# Patient Record
Sex: Male | Born: 1985 | Race: White | Hispanic: No | Marital: Single | Smoking: Current every day smoker
Health system: Southern US, Community
[De-identification: ages and names within clinical notes are randomized; demographics above are authoritative.]

---
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
## 2021-06-20 ENCOUNTER — Ambulatory Visit
Admission: EM | Admit: 2021-06-20 | Discharge: 2021-06-20 | Disposition: A | Discharge status: Home / Self Care | Payer: Self-pay | Attending: Nurse Practitioner | Admitting: Nurse Practitioner

## 2021-06-20 DIAGNOSIS — H66002 Acute suppurative otitis media without spontaneous rupture of ear drum, left ear: Secondary | ICD-10-CM

## 2021-06-20 MED ORDER — AZITHROMYCIN 250 MG PO TABS: ORAL_TABLET | ORAL | 0 refills | Status: DC

## 2021-06-20 NOTE — Discharge Instructions (Signed)
-   You have an ear infection of your left ear -Please start the azithromycin to help clear this up -Please stop using Q-tips; do not insert anything into either of your ears -Seek care if symptoms persist or worsen despite treatment

## 2021-06-20 NOTE — ED Provider Notes (Signed)
[facility]    CSN: [id number] Arrival date & time: 06/20/21  1239      History   Chief Complaint Chief Complaint  Patient presents with   Otalgia    HPI Cody Williamson is a 36 y.o. male.   Patient presents with left ear pain that has been ongoing for the past month.  He denies any fevers, recent upper respiratory infection, drainage from the ear, itching.  He does feel like his hearing is decreased out of that ear.  He does use Q-tips on a regular basis.  He has tried boiling water and hot wax candles which have not helped.  Patient denies any antibiotic use in the past 3 months.  Does report a history of allergy to penicillin.   History reviewed. No pertinent past medical history.  There are no problems to display for this patient.   History reviewed. No pertinent surgical history.     Home Medications    Prior to Admission medications   Medication Sig Start Date End Date Taking? Authorizing Provider  azithromycin (ZITHROMAX) 250 MG tablet Take (2) tablets by mouth on day 1, then take (1) tablet by mouth on days 2-5. 06/20/21  Yes Eulogio Bear, NP    Family History History reviewed. No pertinent family history.  Social History Social History   Tobacco Use   Smoking status: Every Day    Packs/day: 0.50    Types: Cigarettes   Smokeless tobacco: Never  Substance Use Topics   Alcohol use: Yes    Alcohol/week: 3.0 standard drinks    Types: 3 Cans of beer per week   Drug use: Never     Allergies   Penicillins   Review of Systems Review of Systems Per HPI  Physical Exam Triage Vital Signs ED Triage Vitals  Enc Vitals Group     BP 06/20/21 1423 (!) 142/82     Pulse Rate 06/20/21 1423 96     Resp 06/20/21 1423 16     Temp 06/20/21 1423 98.7 F (37.1 C)     Temp Source 06/20/21 1423 Oral     SpO2 06/20/21 1423 97 %     Weight --      Height --      Head Circumference --      Peak Flow --      Pain Score 06/20/21 1421 5      Pain Loc --      Pain Edu? --      Excl. in [place]? --    No data found.  Updated Vital Signs BP (!) 142/82 (BP Location: Right Arm)   Pulse 96   Temp 98.7 F (37.1 C) (Oral)   Resp 16   SpO2 97%   Visual Acuity Right Eye Distance:   Left Eye Distance:   Bilateral Distance:    Right Eye Near:   Left Eye Near:    Bilateral Near:     Physical Exam Vitals and nursing note reviewed.  Constitutional:      General: He is not in acute distress.    Appearance: Normal appearance. He is not toxic-appearing.  HENT:     Head: Normocephalic and atraumatic.     Right Ear: Tympanic membrane, ear canal and external ear normal.     Left Ear: Tympanic membrane is injected, erythematous and bulging. Tympanic membrane is not perforated.     Nose: Nose normal. No congestion.     Right Sinus: No maxillary sinus tenderness or  frontal sinus tenderness.     Left Sinus: No maxillary sinus tenderness or frontal sinus tenderness.     Mouth/Throat:     Mouth: Mucous membranes are moist.     Pharynx: Oropharynx is clear. No oropharyngeal exudate or posterior oropharyngeal erythema.  Eyes:     General: No scleral icterus.    Extraocular Movements: Extraocular movements intact.  Pulmonary:     Effort: Pulmonary effort is normal. No respiratory distress.  Skin:    General: Skin is warm and dry.     Coloration: Skin is not jaundiced or pale.     Findings: No erythema.  Neurological:     Mental Status: He is alert and oriented to person, place, and time.  Psychiatric:        Behavior: Behavior is cooperative.     UC Treatments / Results  Labs (all labs ordered are listed, but only abnormal results are displayed) Labs Reviewed - No data to display  EKG   Radiology No results found.  Procedures Procedures (including critical care time)  Medications Ordered in UC Medications - No data to display  Initial Impression / Assessment and Plan / UC Course  I have reviewed the triage vital  signs and the nursing notes.  Pertinent labs & imaging results that were available during my care of the patient were reviewed by me and considered in my medical decision making (see chart for details).    Treat acute otitis media left ear with azithromycin 500 mg on day 1, 2 and 250 mg days 2 through 5.  He is allergic to penicillins.  Encouraged discontinuing use of Q-tips and encouraged him to not insert anything into his ears anymore.  Can continue using Tylenol and ibuprofen for pain.  Patient declines Toradol injection today to help with pain.  Seek care if symptoms persist or worsen despite treatment. Final Clinical Impressions(s) / UC Diagnoses   Final diagnoses:  Non-recurrent acute suppurative otitis media of left ear without spontaneous rupture of tympanic membrane     Discharge Instructions      - You have an ear infection of your left ear -Please start the azithromycin to help clear this up -Please stop using Q-tips; do not insert anything into either of your ears -Seek care if symptoms persist or worsen despite treatment   ED Prescriptions     Medication Sig Dispense Auth. Provider   azithromycin (ZITHROMAX) 250 MG tablet Take (2) tablets by mouth on day 1, then take (1) tablet by mouth on days 2-5. 6 tablet Eulogio Bear, NP      PDMP not reviewed this encounter.   Eulogio Bear, NP 06/20/21 1440

## 2021-06-20 NOTE — ED Triage Notes (Signed)
Per report left ear pain, hearing decreased x 1 week.

## 2021-07-25 ENCOUNTER — Other Ambulatory Visit: Payer: Self-pay

## 2021-07-25 ENCOUNTER — Ambulatory Visit (HOSPITAL_COMMUNITY)
Admission: AD | Admit: 2021-07-25 | Discharge: 2021-07-25 | Disposition: A | Discharge status: Home / Self Care | Payer: Federal, State, Local not specified - Other | Source: Intra-hospital | Attending: Psychiatry | Admitting: Psychiatry

## 2021-07-25 ENCOUNTER — Encounter (HOSPITAL_COMMUNITY): Payer: Self-pay

## 2021-07-25 ENCOUNTER — Ambulatory Visit (HOSPITAL_COMMUNITY)
Admission: EM | Admit: 2021-07-25 | Discharge: 2021-07-26 | Disposition: A | Discharge status: Home / Self Care | Payer: No Payment, Other | Attending: Psychiatry | Admitting: Psychiatry

## 2021-07-25 ENCOUNTER — Emergency Department (HOSPITAL_COMMUNITY)
Admission: EM | Admit: 2021-07-25 | Discharge: 2021-07-25 | Disposition: A | Discharge status: Home / Self Care | Payer: Self-pay | Attending: Emergency Medicine | Admitting: Emergency Medicine

## 2021-07-25 DIAGNOSIS — F15220 Other stimulant dependence with intoxication, uncomplicated: Secondary | ICD-10-CM | POA: Insufficient documentation

## 2021-07-25 DIAGNOSIS — Z5900 Homelessness unspecified: Secondary | ICD-10-CM | POA: Insufficient documentation

## 2021-07-25 DIAGNOSIS — F11220 Opioid dependence with intoxication, uncomplicated: Secondary | ICD-10-CM | POA: Insufficient documentation

## 2021-07-25 DIAGNOSIS — F1110 Opioid abuse, uncomplicated: Secondary | ICD-10-CM | POA: Insufficient documentation

## 2021-07-25 DIAGNOSIS — F10220 Alcohol dependence with intoxication, uncomplicated: Secondary | ICD-10-CM | POA: Insufficient documentation

## 2021-07-25 DIAGNOSIS — F1910 Other psychoactive substance abuse, uncomplicated: Secondary | ICD-10-CM | POA: Insufficient documentation

## 2021-07-25 DIAGNOSIS — F10229 Alcohol dependence with intoxication, unspecified: Secondary | ICD-10-CM

## 2021-07-25 DIAGNOSIS — F1920 Other psychoactive substance dependence, uncomplicated: Secondary | ICD-10-CM

## 2021-07-25 DIAGNOSIS — F1520 Other stimulant dependence, uncomplicated: Secondary | ICD-10-CM

## 2021-07-25 DIAGNOSIS — Z20822 Contact with and (suspected) exposure to covid-19: Secondary | ICD-10-CM | POA: Insufficient documentation

## 2021-07-25 DIAGNOSIS — F1010 Alcohol abuse, uncomplicated: Secondary | ICD-10-CM | POA: Insufficient documentation

## 2021-07-25 LAB — CBC WITH DIFFERENTIAL/PLATELET
Abs Immature Granulocytes: 0.03 10*3/uL (ref 0.00–0.07)
Basophils Absolute: 0.1 10*3/uL (ref 0.0–0.1)
Basophils Relative: 1 %
Eosinophils Absolute: 0.1 10*3/uL (ref 0.0–0.5)
Eosinophils Relative: 2 %
HCT: 46.3 % (ref 39.0–52.0)
Hemoglobin: 15.5 g/dL (ref 13.0–17.0)
Immature Granulocytes: 1 %
Lymphocytes Relative: 43 %
Lymphs Abs: 2.8 10*3/uL (ref 0.7–4.0)
MCH: 30.1 pg (ref 26.0–34.0)
MCHC: 33.5 g/dL (ref 30.0–36.0)
MCV: 89.9 fL (ref 80.0–100.0)
Monocytes Absolute: 0.5 10*3/uL (ref 0.1–1.0)
Monocytes Relative: 7 %
Neutro Abs: 3.1 10*3/uL (ref 1.7–7.7)
Neutrophils Relative %: 46 %
Platelets: 359 10*3/uL (ref 150–400)
RBC: 5.15 MIL/uL (ref 4.22–5.81)
RDW: 11.5 % (ref 11.5–15.5)
WBC: 6.6 10*3/uL (ref 4.0–10.5)
nRBC: 0 % (ref 0.0–0.2)

## 2021-07-25 LAB — HEMOGLOBIN A1C
Hgb A1c MFr Bld: 5 % (ref 4.8–5.6)
Mean Plasma Glucose: 96.8 mg/dL

## 2021-07-25 LAB — COMPREHENSIVE METABOLIC PANEL
ALT: 41 U/L (ref 0–44)
AST: 35 U/L (ref 15–41)
Albumin: 4.2 g/dL (ref 3.5–5.0)
Alkaline Phosphatase: 75 U/L (ref 38–126)
Anion gap: 12 (ref 5–15)
BUN: 11 mg/dL (ref 6–20)
CO2: 22 mmol/L (ref 22–32)
Calcium: 9.4 mg/dL (ref 8.9–10.3)
Chloride: 106 mmol/L (ref 98–111)
Creatinine, Ser: 0.94 mg/dL (ref 0.61–1.24)
GFR, Estimated: >60 mL/min (ref >60)
Glucose, Bld: 98 mg/dL (ref 70–99)
Potassium: 5 mmol/L (ref 3.5–5.1)
Sodium: 140 mmol/L (ref 135–145)
Total Bilirubin: 0.6 mg/dL (ref 0.3–1.2)
Total Protein: 7 g/dL (ref 6.5–8.1)

## 2021-07-25 LAB — ETHANOL: Alcohol, Ethyl (B): 96 mg/dL — ABNORMAL HIGH (ref <10)

## 2021-07-25 LAB — LIPID PANEL
Cholesterol: 220 mg/dL — ABNORMAL HIGH (ref 0–200)
HDL: 65 mg/dL (ref >40)
LDL Cholesterol: 112 mg/dL — ABNORMAL HIGH (ref 0–99)
Total CHOL/HDL Ratio: 3.4 RATIO
Triglycerides: 213 mg/dL — ABNORMAL HIGH (ref <150)
VLDL: 43 mg/dL — ABNORMAL HIGH (ref 0–40)

## 2021-07-25 LAB — RESP PANEL BY RT-PCR (FLU A&B, COVID) ARPGX2
Influenza A by PCR: NEGATIVE
Influenza B by PCR: NEGATIVE
SARS Coronavirus 2 by RT PCR: NEGATIVE

## 2021-07-25 MED ORDER — ACETAMINOPHEN 325 MG PO TABS
650.0000 mg | ORAL_TABLET | Freq: Four times a day (QID) | ORAL | Status: DC | PRN
  Administered 2021-07-25: 650 mg via ORAL
  Filled 2021-07-25: qty 2

## 2021-07-25 MED ORDER — ALUM & MAG HYDROXIDE-SIMETH 200-200-20 MG/5ML PO SUSP: 30.0000 mL | ORAL | Status: DC | PRN

## 2021-07-25 MED ORDER — TRAZODONE HCL 50 MG PO TABS
50.0000 mg | ORAL_TABLET | Freq: Once | ORAL | Status: AC
Start: 2021-07-25 — End: 2021-07-25
  Administered 2021-07-25: 50 mg via ORAL
  Filled 2021-07-25: qty 1

## 2021-07-25 MED ORDER — MAGNESIUM HYDROXIDE 400 MG/5ML PO SUSP: 30.0000 mL | Freq: Every day | ORAL | Status: DC | PRN
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
## 2021-07-25 NOTE — Discharge Instructions (Addendum)
You were seen in the emergency department requesting detox.  Unfortunately, we do not do that at this facility. However, I've attached a list of several places in the area that can help you with this.  Return to the emergency department for any of the following symptoms: hallucinations, seizures, persistent nausea or vomiting.
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
## 2021-07-25 NOTE — ED Notes (Signed)
Pt sleeping at present, no distress noted. REspirations even & unlabored.  Monitoring for safety.

## 2021-07-25 NOTE — ED Triage Notes (Signed)
Pt reports wanting detox from heroin, meth, and alcohol. Pt states he was told by [facility] [place] that he needed to detox prior to be accepted there. Pt states he used heroin, meth and drank a fifth of liquor this morning.

## 2021-07-25 NOTE — ED Provider Notes (Signed)
[facility]-EMERGENCY DEPT Provider Note   CSN: [id number] Arrival date & time: 07/25/21  1547     History  Chief Complaint  Patient presents with   Alcohol Problem   Drug Problem    Cody Williamson is a 36 y.o. male with history of alcohol abuse and polysubstance abuse who presents the emergency department requesting detox from heroin, meth, and alcohol.  Patient states that he just [facility] his [facility] off at [facility] [place], and was told that he needed to complete detox prior to being accepted there.  He states he last use heroin and meth this morning, as well as drink 1/5 of alcohol.  States in the past when he is withdrawn from alcohol he has had some tremors and vomiting, has never had a seizure before.  Has no complaints at this time.  Alcohol Problem This is a chronic problem.  Drug Problem This is a chronic problem.       Home Medications Prior to Admission medications   Medication Sig Start Date End Date Taking? Authorizing Provider  azithromycin (ZITHROMAX) 250 MG tablet Take (2) tablets by mouth on day 1, then take (1) tablet by mouth on days 2-5. 06/20/21   Valentino Nose, NP      Allergies    Penicillins    Review of Systems   Review of Systems  All other systems reviewed and are negative.   Physical Exam Updated Vital Signs BP 127/79 (BP Location: Left Arm)   Pulse (!) 110   Temp 98.6 F (37 C) (Oral)   Resp 16   SpO2 96%  Physical Exam Vitals and nursing note reviewed.  Constitutional:      Appearance: Normal appearance.     Comments: Patient is clearly intoxicated but alert and oriented  HENT:     Head: Normocephalic and atraumatic.  Eyes:     Conjunctiva/sclera: Conjunctivae normal.  Pulmonary:     Effort: Pulmonary effort is normal. No respiratory distress.  Skin:    General: Skin is warm and dry.  Neurological:     Mental Status: He is alert.     Motor: No tremor or seizure activity.  Psychiatric:         Attention and Perception: Attention normal. He does not perceive auditory or visual hallucinations.        Mood and Affect: Mood normal.        Speech: Speech normal.        Behavior: Behavior normal.        Thought Content: Thought content normal.        Judgment: Judgment normal.     ED Results / Procedures / Treatments   Labs (all labs ordered are listed, but only abnormal results are displayed) Labs Reviewed - No data to display  EKG None  Radiology No results found.  Procedures Procedures    Medications Ordered in ED Medications - No data to display  ED Course/ Medical Decision Making/ A&P                           Medical Decision Making  Patient is a 36 year old male with history of alcohol abuse and polysubstance abuse who presents the emergency department requesting detox from heroin, meth, and alcohol.  He just dropped his stuff off at a [facility] [place], but was told he needed to detox prior to being accepted there.  Last use heroin, meth, and alcohol  this morning.  On my exam patient is clearly intoxicated, but is alert and oriented.  Slightly tachycardic to 110, but otherwise normal vital signs.  Although he is not actively withdrawing as he is intoxicated, he has a CIWA score <8 so do not believe he needs acute intervention at this time.  Patient given something to eat, as well as provided with resources for substance abuse programs in the area.  Given a bus pass to hopefully get to 1 of these.  Patient is agreeable to the plan, and understands reasons to return to the emergency department.  All questions answered.  Final Clinical Impression(s) / ED Diagnoses Final diagnoses:  Alcohol dependence with uncomplicated intoxication (HCC)  Opioid dependence with uncomplicated intoxication (HCC)  Methamphetamine dependence (HCC)    Rx / DC Orders ED Discharge Orders     None      Portions of this report may have been transcribed using voice  recognition software. Every effort was made to ensure accuracy; however, inadvertent computerized transcription errors may be present.    Jeanella Flattery 07/25/21 1710    Charlynne Pander, MD 07/25/21 [phone]

## 2021-07-25 NOTE — ED Notes (Signed)
Pt A&O x 4, presents as referral from [facility] [place]. Requesting that pt get detox for 3 days before he could return to [facility].  Denies SI, HI or AVH.  Pt admits to alcohol use, Heroin, Methamphetamine use.  Pt pleasant, anxious and cooperative. Comfort measures given.  Monitoring for safety.

## 2021-07-26 ENCOUNTER — Encounter (HOSPITAL_COMMUNITY): Payer: Self-pay | Admitting: Emergency Medicine

## 2021-07-26 LAB — POC SARS CORONAVIRUS 2 AG: SARSCOV2ONAVIRUS 2 AG: NEGATIVE

## 2021-07-26 NOTE — ED Notes (Signed)
Patient A&Ox4. Denies intent to harm self/others when asked. Denies A/VH. Patient denies any physical complaints when asked. No acute distress noted. Support and encouragement provided. Routine safety checks conducted according to facility protocol. Encouraged patient to notify staff if thoughts of harm toward self or others arise. Patient verbalize understanding and agreement. Will continue to monitor for safety.    

## 2021-07-26 NOTE — ED Notes (Signed)
MD at bedside. 

## 2021-07-26 NOTE — ED Notes (Signed)
Pt sleeping at present, no distress noted. Respirations even & unlabored.  Monitoring for safety. 

## 2021-07-26 NOTE — ED Notes (Signed)
Patient resting quietly in bed with eyes closed, Respirations equal and unlabored, skin warm and dry, NAD. Routine safety checks conducted according to facility protocol. Will continue to monitor for safety. 

## 2021-07-26 NOTE — ED Provider Notes (Signed)
FBC/OBS ASAP Discharge Summary  Date and Time: 07/26/2021 7:50 AM  Name: Cody Williamson  MRN:  [id number]   Discharge Diagnoses:  Final diagnoses:  Alcohol abuse  Polysubstance abuse (HCC)  Opiate abuse, continuous (HCC)    Subjective: Patient seen and evaluated face-to-face by this provider, and chart reviewed. On evaluation, patient is alert and oriented x4. His thought process is logical and goal oriented.  His speech is clear and coherent. His mood is euthymic and affect is congruent. Patient states that he went to the [facility] [place] yesterday but had been drinking so he was sent to the hospital for detox. He states that he only needed to be sober for 24 hours and then he could return back to the facility. He reports to drinking 5 shots of liquor yesterday. He reports drinking a 1/5 of liquor on average. He reports  drinking alcohol everyday since October 2022. He reports a sobriety of 3 years prior to relapsing in October. He reports using heroin everyday for the past 3 months, on average a half a gram per day. He states that he last used heroin 3 days ago. He reports using heroin since age 78. He reports buying Suboxone from off the streets for the past 3 days, he reports taking 1 tablet x 3 days. He states that he was trying to wean himself off of the Suboxone. He reports smoking marijuana every day since age 65. He reports withdrawal symptoms of body aches, and muscle spasms. He denies a history of alcohol withdrawal seizures or delirium tremens. He denies suicidal ideations. He denies past suicide attempts. He denies homicidal ideations. He denies auditory and visual hallucinations. There is no objective evidence that the patient is currently responding to internal or external stimuli. He denies depressive symptoms. He reports a fair appetite. He reports poor sleep. He states that he is currently homeless and is from [place]. He states that he plans to return to [facility] [place]  for ongoing substance use treatment. He denies a past psychiatric history. Denies past psychiatric services or therapy. Denies currently taking prescribed medications. Denies a psychiatric family history or family hx of drug abuse.  Stay Summary: Cody Williamson, 36 y.o male, history of alcohol abuse, and polysubstance abuse. Patient was seen at [facility], and transferred to the [facility], seeking  detox treatment. Per the patient he is currently at [facility] [place], he stated that his belongings are there but they told him he needs to come back within 24 hours. Patient stated he needed a paper to show that he is sober for 24 hours. Patient was admitted to the continuous assessment unit and observed overnight at the [facility]. Labs obtained includes CBC, CMP, BAL, A1c, lipid panel, UDS, and COVID. Patient's BAL on arrival was 96. Patient did not complete urine drug screen.  Patient observed on the unit without any disruptive, aggressive, self-harm, or psychotic behaviors. No significant signs of withdrawal observed.  Patient was offered admission to the facility based crisis unit for detox treatment. However, patient declined and stated that he is ready to discharge and plans to return back to [facility] [place]. He stated  that he only needed 24 hours to sober up. He was advised that if he starts to experience excessive vomiting, diarrhea, hallucinations, confusion or a seizure to call 911 or present to the closest emergency department. He declined follow-up resources for outpatient psychiatry or for substance abuse treatment. He requested a bus pass for  transportation to [facility] [place]. He was provided with a bus pass per request.  Total Time spent with patient: 20 minutes  Past Psychiatric History:No significant psychiatric history. History of Alcohol abuse, Heroin use, and opiate abuse.   Past Medical History: History reviewed. No pertinent past medical history. History reviewed. No  pertinent surgical history. Family History: History reviewed. No pertinent family history. Family Psychiatric History: No family history.  Social History:  Social History   Substance and Sexual Activity  Alcohol Use Yes   Alcohol/week: 3.0 standard drinks of alcohol   Types: 3 Cans of beer per week     Social History   Substance and Sexual Activity  Drug Use Never    Social History   Socioeconomic History   Marital status: Single    Spouse name: Not on file   Number of children: Not on file   Years of education: Not on file   Highest education level: Not on file  Occupational History   Not on file  Tobacco Use   Smoking status: Every Day    Packs/day: 0.50    Types: Cigarettes   Smokeless tobacco: Never  Substance and Sexual Activity   Alcohol use: Yes    Alcohol/week: 3.0 standard drinks of alcohol    Types: 3 Cans of beer per week   Drug use: Never   Sexual activity: Yes  Other Topics Concern   Not on file  Social History Narrative   Not on file   Social Determinants of Health   Financial Resource Strain: Not on file  Food Insecurity: Not on file  Transportation Needs: Not on file  Physical Activity: Not on file  Stress: Not on file  Social Connections: Not on file   SDOH:  SDOH Screenings   Alcohol Screen: Not on file  Depression (PHQ2-9): Not on file  Financial Resource Strain: Not on file  Food Insecurity: Not on file  Housing: Not on file  Physical Activity: Not on file  Social Connections: Not on file  Stress: Not on file  Tobacco Use: High Risk (07/26/2021)   Patient History    Smoking Tobacco Use: Every Day    Smokeless Tobacco Use: Never    Passive Exposure: Not on file  Transportation Needs: Not on file    Tobacco Cessation:  Prescription not provided because: declined  Current Medications:  Current Facility-Administered Medications  Medication Dose Route Frequency Provider Last Rate Last Admin   acetaminophen (TYLENOL) tablet 650 mg   650 mg Oral Q6H PRN Sindy Guadeloupe, NP   650 mg at 07/25/21 2223   alum & mag hydroxide-simeth (MAALOX/MYLANTA) 200-200-20 MG/5ML suspension 30 mL  30 mL Oral Q4H PRN Sindy Guadeloupe, NP       magnesium hydroxide (MILK OF MAGNESIA) suspension 30 mL  30 mL Oral Daily PRN Sindy Guadeloupe, NP       Current Outpatient Medications  Medication Sig Dispense Refill   azithromycin (ZITHROMAX) 250 MG tablet Take (2) tablets by mouth on day 1, then take (1) tablet by mouth on days 2-5. 6 tablet 0    PTA Medications: (Not in a hospital admission)       No data to display          Flowsheet Row ED from 07/25/2021 in [facility] Most recent reading at 07/25/2021  9:53 PM ED from 07/25/2021 in [facility] Most recent reading at 07/25/2021  4:03 PM ED from 06/20/2021 in [facility] [place] Most recent reading at 06/20/2021  2:23 PM  C-SSRS RISK CATEGORY No Risk No Risk No Risk       Musculoskeletal  Strength & Muscle Tone: within normal limits Gait & Station: normal Patient leans: N/A  Psychiatric Specialty Exam  Presentation  General Appearance: Appropriate for Environment  Eye Contact:Fair  Speech:Clear and Coherent  Speech Volume:Normal  Handedness:Right   Mood and Affect  Mood:Euthymic  Affect:Congruent   Thought Process  Thought Processes:Coherent; Linear  Descriptions of Associations:Intact  Orientation:Full (Time, Place and Person)  Thought Content:Logical     Hallucinations:Hallucinations: None  Ideas of Reference:None  Suicidal Thoughts:Suicidal Thoughts: No SI Passive Intent and/or Plan: Without Intent; Without Plan; Without Access to Means  Homicidal Thoughts:Homicidal Thoughts: No   Sensorium  Memory:Immediate Fair; Recent Fair; Remote Fair  Judgment:Intact  Insight:Present   Executive Functions  Concentration:Fair  Attention Span:Fair  Recall:Fair  Fund of  Knowledge:Fair  Language:Fair   Psychomotor Activity  Psychomotor Activity:Psychomotor Activity: Normal   Assets  Assets:Communication Skills; Desire for Improvement; Leisure Time; Physical Health; Resilience   Sleep  Sleep:Sleep: Poor Number of Hours of Sleep: 4   Nutritional Assessment (For OBS and FBC admissions only) Has the patient had a weight loss or gain of 10 pounds or more in the last 3 months?: No Has the patient had a decrease in food intake/or appetite?: No Does the patient have dental problems?: No Does the patient have eating habits or behaviors that may be indicators of an eating disorder including binging or inducing vomiting?: No Has the patient recently lost weight without trying?: 0    Physical Exam  Physical Exam HENT:     Head: Normocephalic.     Nose: Nose normal.  Eyes:     Conjunctiva/sclera: Conjunctivae normal.  Cardiovascular:     Rate and Rhythm: Normal rate.  Pulmonary:     Effort: Pulmonary effort is normal.  Neurological:     Mental Status: He is alert and oriented to person, place, and time.    Review of Systems  Constitutional: Negative.   HENT: Negative.    Eyes: Negative.   Respiratory: Negative.    Cardiovascular: Negative.   Gastrointestinal: Negative.   Musculoskeletal:  Positive for joint pain.  Skin: Negative.   Endo/Heme/Allergies: Negative.   Psychiatric/Behavioral:  Positive for substance abuse.    Blood pressure 116/74, pulse 66, temperature 97.9 F (36.6 C), temperature source Oral, resp. rate 18, SpO2 98 %. There is no height or weight on file to calculate BMI.  Demographic Factors:  Male, Caucasian, Low socioeconomic status, and Unemployed  Loss Factors: Financial problems/change in socioeconomic status  Historical Factors: NA  Risk Reduction Factors:   Sense of responsibility to family, Religious beliefs about death, and Positive coping skills or problem solving skills  Continued Clinical Symptoms:   Alcohol/Substance Abuse/Dependencies  Cognitive Features That Contribute To Risk:  None    Suicide Risk:  Minimal: No identifiable suicidal ideation.  Patients presenting with no risk factors but with morbid ruminations; may be classified as minimal risk based on the severity of the depressive symptoms  Plan Of Care/Follow-up recommendations:  Activity:  as tolerated   Per patient, he will follow up with [facility] [place] for substance use treatment and housing.   Patient declined follow up resources from social work. I personally added resources to his AVS for substance use treatment and local shelters in the area.   Disposition: Discharge to self/home. Patient plans to return  back to [facility] [place].   Alexiz Cothran L, NP 07/26/2021, 7:50 AM

## 2021-07-26 NOTE — ED Notes (Signed)
Patient A&O x 4, ambulatory. Patient discharged in no acute distress. Patient denied SI/HI, A/VH upon discharge. Patient verbalized understanding of all discharge instructions explained by staff, to include follow up appointments, and safety plan. Patient reported mood 10/10.  Pt belongings returned to patient from locker #  11 intact. Patient escorted to lobby via staff for transport to destination. Safety maintained.   

## 2021-07-26 NOTE — Discharge Instructions (Addendum)
Discharge recommendations:   Please see information for follow-up appointment with psychiatry and therapy.  Please follow up with your primary care provider for all medical related needs.   Safety:  The patient should abstain from use of illicit substances/drugs and abuse of any medications. If symptoms worsen or do not continue to improve or if the patient becomes actively suicidal or homicidal then it is recommended that the patient return to the closest hospital emergency department, the [facility], or call 911 for further evaluation and treatment. National Suicide Prevention Lifeline 1-800-SUICIDE or 1-800-273-8255.  About 988 988 offers 24/7 access to trained crisis counselors who can help people experiencing mental health-related distress. People can call or text 988 or chat 988lifeline.org for themselves or if they are worried about a loved one who may need crisis support.     

## 2021-08-04 ENCOUNTER — Ambulatory Visit (HOSPITAL_COMMUNITY)
Admission: EM | Admit: 2021-08-04 | Discharge: 2021-08-04 | Disposition: A | Discharge status: Home / Self Care | Payer: No Payment, Other | Attending: Psychiatry | Admitting: Psychiatry

## 2021-08-04 DIAGNOSIS — F1190 Opioid use, unspecified, uncomplicated: Secondary | ICD-10-CM

## 2021-08-04 DIAGNOSIS — R451 Restlessness and agitation: Secondary | ICD-10-CM | POA: Insufficient documentation

## 2021-08-04 DIAGNOSIS — F1110 Opioid abuse, uncomplicated: Secondary | ICD-10-CM | POA: Insufficient documentation

## 2021-08-04 NOTE — Discharge Instructions (Addendum)
Substance Abuse Treatment Programs  Intensive Outpatient Programs [facility]     [address]      [place], [place]                   [phone]       [facility] [address] [place] [address] [place], [place] [phone]  [facility]     (Inpatient and outpatient)     [address]           [phone]    [facility] [phone] (Suboxone and Methadone)  [address]      [place], [place] [address]      [phone]       [address] [place], [place] [phone]  [facility] (Outpatient/Inpatient, [employer])    (insurance only) [phone]             [facility] (Groups & Residential) [place], [place] [phone]     [facility]     [address]     [place], [place]      [phone]       [facility] (for caregivers and family) [phone] [address] [place], [place] [phone]      Residential Treatment Programs [facility]      [address], [place], [place] [address]  [phone]       [facility] [address], [place], [place] [address] [phone]  [facility] [place]        [phone]       [facility] [phone]  [facility]             [address]                                         [place], [place]                                                [phone] or [phone]                               [facility] [address] [place], [address] [phone]  [facility]    [address]      [place], [place]     [phone]       [facility] [address] [place], [place] [phone]  [facility]   [address] [place], [place] [address]     [phone]      Admissions: 8am-3pm M-F  [facility] ([facility]) [address] [place],  [place] [phone]  [facility]: Residential Program [phone] Days)   [place], [place]      [phone] or [phone]     [facility]: [facility] [place], [place] (Walk in Hours over the weekend or by referral)  [facility] [address] [place], [place], [place] [address] [phone]  Crisis Mobile: [facility]:  [phone] (for crisis response 24 hours a day) [facility] Hotline:      7161168932 Outpatient Psychiatry and Counseling  [facility]:  Mobile Crisis Management 24 hours:  [phone]  [facility] of the [employer] sliding scale fee and walk in schedule: M-F 8am-12pm/1pm-3pm [address]  [place], [place] [address] [phone]  [facility] [address] [place], [place] [address] [phone]  [facility] ([facility] [employer])- new patient walk-in appointments available Monday - Friday 8am -3pm.          [address] [place], [place] [address] [phone] or crisis line- [phone]  [facility] Outpatient Services/ Intensive Outpatient Therapy Program [address] [place], [place] [address] [phone]  [facility]                  Crisis Services      [phone] [address]     [place], [place] [address]                 [facility]   [facility] [phone]. [address] [place], [place] [address]   [facility]          [address] Bea Laura  [place], [place] [address]       [phone]  [facility] [address] [place], [place] [address] [phone]  [facility]    [address]    [place], [place] [address]     [phone]       Andee Poles, MD     [address]     [place] [address]     [phone]       [facility] [address] [place] [address]  [facility]     [address] [place], [place]      [phone]       [facility] Eulogio Ditch, MD [address] [place], [place] [address] [phone]  [facility]     [address]     [place], [place] [address]     [phone]       [facility] [address] [place], [place] [address] [phone] Resources for Temporary Residential Assistance/Crisis Centers  DAY CENTERS [facility]) M-F 8am-3pm   [address] [place], [place] [address]   [phone] Services include: laundry, barbering, support groups, case management, phone  & computer access, showers, AA/NA mtgs, mental health/substance abuse nurse, job skills class, disability information, VA assistance, spiritual classes, etc.   HOMELESS SHELTERS  [facility]     [facility]   [address], [place]     [phone]              [employer] (women and children)       [address] [place], [place] [address] [phone] [email or web address] for application and process Application Required  [facility]   [address]    [place] [address]     [phone]                    [facility] [place] [address] [place], [place] [address] [phone] application appt.) Application Required  [facility] (women only)    [address]     [place], [place] [address]     [phone]  Intake starts 6pm daily Need valid ID, SSC, & Police report [employer] [address] [place], [place] [id number] Application Required  [employer] (men only)     [place]      [place], [place]       [place] (Pregnant women only) [address] [place], [place]  [facility]      [address]      [place], [place] [address]     [phone]             [facility] [address] [place], [place] 90 day commitment/SA/Application process  [facility](men only)     [address]     [place], [place]       Check-in at [facility] [address] [place], [place] [address] [phone] Men/Women/Women and Children must be there by 7 pm  [place], [place]      Please present to one of the following facilities to start medication management and therapy services:   [facility] [place],  [place], [place] [address] [phone]

## 2021-08-04 NOTE — ED Notes (Signed)
Patient was discharged by provider. Patient was given AVS with community resources.  

## 2021-08-04 NOTE — Progress Notes (Signed)
   08/04/21 1611  [facility] Triage Screening (Walk-ins at [facility] only)  How Did You Hear About [place]? Self  What Is the Reason for Your Visit/Call Today? Patient is a 36 y.o. male with a hx of polysubstance abuse who presents requesting detox.  Patient was just seen following a fentanyl overdose at [facility] around 3AM.  He wa monitored, as per protocol fpr 2 hours, and released.  He was not admitted to [facility] detox program, as he had not used for over a week prior to the overdose. Upon evaluation with the provider, patient advised he does not meet detox criteria, at which point he changed his report to be that he has been "using a litle something every day for 7-8 days.  Check my urine."  He then became beligerent when he began to realize he may not be admitted.  He demanded to be able to stay "just for a couple of days so I can go back to my program. It's all I have in this world."  He states the program has been helpful, however then admits to using "something" daily since he arrived.  It appears patient has been malingering for "time to sober up" some place so he would be able to return to [facility] [place].  Patient is not endorsing SI, HI or AVH.  How Long Has This Been Causing You Problems? 1 wk - 1 month  Have You Recently Had Any Thoughts About Hurting Yourself? No  Have you Recently Had Thoughts About Hurting Someone Karolee Ohs? No  Are You Planning To Harm Someone At This Time? No  Are you currently experiencing any auditory, visual or other hallucinations? No  Have You Used Any Alcohol or Drugs in the Past 24 Hours? Yes  How long ago did you use Drugs or Alcohol? around 1 am  What Did You Use and How Much? fentanyl ($20) worth  Do you have any current medical co-morbidities that require immediate attention? No  Clinician description of patient physical appearance/behavior: Patient is irritable/agitated, and disrespectful towards this clinician and provider.  He is AAOx5  What Do You  Feel Would Help You the Most Today? Alcohol or Drug Use Treatment  If access to [facility] was not available, would you have sought care in the Emergency Department? No  Determination of Need Routine (7 days)  Options For Referral Other: Comment (Return to [employer] [facility] [place] to complete the residential SA program.)

## 2021-08-04 NOTE — ED Provider Notes (Signed)
Behavioral Health Urgent Care Medical Screening Exam  Patient Name: Cody Williamson MRN: [id number] Date of Evaluation: 08/04/21 Chief Complaint:   Diagnosis:  Final diagnoses:  Opiate abuse, episodic (HCC)  Opioid use    History of Present illness: Cody Williamson is a 36 y.o. male patient [facility] as a walk in requesting to stay for 2-3 days for opioid detox.  Cody Williamson, 36 y.o., male patient seen face to face by this provider, consulted with Dr. Lucianne Muss; and chart reviewed on 08/04/21.  He currently denies taking any medications or having any outpatient psychiatric services in place.  Per chart review patient has a history of polysubstance abuse.  [facility] at [facility] due [facility].  Patient was monitored and discharged with resources.  The physician noted that patient is at a high risk of relapse and overdose, but he did not meet the criteria for detox at this time.  Patient reports he has been clean for 1 week and only relapsed while he was at work that resulted in an overdose.  He resides with [facility] [place].  Patient presents to [facility] via part bus, pass was given to him at [employer]  He is requesting to be admitted.  He states, "I need to stay here I want to get back into my program".  Educated patient that relapsing one-time does not meet the criteria to be admitted for opioid detox.  He became agitated and states he had lied to [facility] about his substance use.  States he has actually been using Subutex for the past week. However he is inconsistent and changes his story throughout the assessment.  He provided permission to call his leader Tasia Catchings [phone] with [facility] [place].  Call attempt was made and a message was left.  Informed patient that he would be discharged with resources.  He became extremely agitated and demanded to be able to stay "just for a couple of days so I can go back to my program. It's all I have in  this world", he then stated, "have you read the reviews on this place as a joke".  He also states, "what we will have to do say I will kill myself to get admitted".  As he was storming out the door he looked at this Clinical research associate and states, "this is going to be on you".  Patient focus is on housing.  He mentioned multiple times throughout the assessment, "if you are not going to keep me just go ahead send me out so I can make myself a tent in the woods".  He also referenced the hot weather and being out in it all day.  During evaluation Cody Williamson is observed sitting in lobby in no acute distress.  He is alert/oriented x4 and cooperative.  He is pleasant and calm initially.  He is fairly groomed and makes good eye contact.  Initially his speech and behavior is normal.  He denies depression.  He endorses anxiety.  He denies SI/HI/AVH.  However after discussing that he does not meet criteria for opioid detox and admission.  He became agitated.  His speech became loud and pressured.  He was provided with outpatient psychiatric resources for medication management, therapy, and substance abuse treatment.  He was provided with a part past and [place] pass due to residing in [facility].   Psychiatric Specialty Exam  Presentation  General Appearance:Disheveled  Eye Contact:Good  Speech:Clear and Coherent; Normal Rate  Speech Volume:Normal  Handedness:Right   Mood and Affect  Mood:Anxious  Affect:Congruent   Thought Process  Thought Processes:Coherent  Descriptions of Associations:Intact  Orientation:Full (Time, Place and Person)  Thought Content:Logical    Hallucinations:None  Ideas of Reference:None  Suicidal Thoughts:No Without Intent; Without Plan; Without Access to Means  Homicidal Thoughts:No   Sensorium  Memory:Immediate Good; Recent Good; Remote Good  Judgment:Fair  Insight:Fair   Executive Functions  Concentration:Good  Attention  Span:Good  Recall:Good  Fund of Knowledge:Good  Language:Good   Psychomotor Activity  Psychomotor Activity:Normal   Assets  Assets:Communication Skills; Desire for Improvement; Financial Resources/Insurance; Physical Health; Resilience; Housing   Sleep  Sleep:Fair  Number of hours: 4   No data recorded  Physical Exam: Physical Exam Vitals and nursing note reviewed.  Constitutional:      General: He is not in acute distress.    Appearance: He is well-developed.  Eyes:     General:        Right eye: No discharge.        Left eye: No discharge.     Conjunctiva/sclera: Conjunctivae normal.  Cardiovascular:     Heart sounds: No murmur heard. Pulmonary:     Effort: Pulmonary effort is normal. No respiratory distress.  Musculoskeletal:        General: Normal range of motion.     Cervical back: Normal range of motion.  Skin:    Coloration: Skin is not jaundiced or pale.  Neurological:     Mental Status: He is alert and oriented to person, place, and time.  Psychiatric:        Attention and Perception: Attention and perception normal.        Mood and Affect: Mood is anxious. Affect is angry.        Speech: Speech normal.        Behavior: Behavior is agitated. Behavior is cooperative.        Thought Content: Thought content normal.        Cognition and Memory: Cognition normal.        Judgment: Judgment is impulsive.    Review of Systems  Constitutional: Negative.   HENT: Negative.    Eyes: Negative.   Respiratory: Negative.    Cardiovascular: Negative.   Genitourinary: Negative.   Musculoskeletal: Negative.   Skin: Negative.   Neurological: Negative.   Psychiatric/Behavioral:  Positive for substance abuse. The patient is nervous/anxious.    Blood pressure 138/80, pulse 92, temperature 98.6 F (37 C), temperature source Oral, resp. rate 18, SpO2 97 %. There is no height or weight on file to calculate BMI.  Musculoskeletal: Strength & Muscle Tone: within  normal limits Gait & Station: normal Patient leans: N/A   [facility] MSE Discharge Disposition for Follow up and Recommendations: Based on my evaluation the patient does not appear to have an emergency medical condition and can be discharged with resources and follow up care in outpatient services for Medication Management and Individual Therapy  Discharge patient  Provided outpatient psychiatric resources for medication management, therapy, and substance abuse treatment.  Provided a park pass and [place] pass for transportation due to patient living in [place].    Ardis Hughs, NP 08/04/2021, 5:25 PM

## 2021-08-14 ENCOUNTER — Telehealth (HOSPITAL_COMMUNITY): Payer: Self-pay

## 2021-08-14 NOTE — BH Assessment (Signed)
Care Management - [facility]UC Follow Up Discharges   Writer attempted to make contact with patient today and was unsuccessful.  Voicemail is not set up.    Per chart review, patient will follow up with [facility] [place].
